# Patient Record
Sex: Female | Born: 1970 | Race: Black or African American | Hispanic: No | Marital: Married | Smoking: Never smoker
Health system: Southern US, Community
[De-identification: ages and names within clinical notes are randomized; demographics above are authoritative.]

## PROBLEM LIST (undated history)

## (undated) DIAGNOSIS — E119 Type 2 diabetes mellitus without complications: Secondary | ICD-10-CM

## (undated) HISTORY — DX: Type 2 diabetes mellitus without complications: E11.9

---

## 2008-08-12 ENCOUNTER — Inpatient Hospital Stay (HOSPITAL_COMMUNITY): Admission: AD | Admit: 2008-08-12 | Discharge: 2008-08-16 | Payer: Self-pay | Admitting: Obstetrics

## 2010-09-22 ENCOUNTER — Emergency Department (HOSPITAL_COMMUNITY): Admission: EM | Admit: 2010-09-22 | Discharge: 2010-09-22 | Payer: Self-pay | Admitting: Emergency Medicine

## 2010-11-10 ENCOUNTER — Ambulatory Visit (HOSPITAL_COMMUNITY): Admission: RE | Admit: 2010-11-10 | Payer: Self-pay | Admitting: Obstetrics & Gynecology

## 2010-12-12 ENCOUNTER — Encounter: Payer: Self-pay | Admitting: Obstetrics & Gynecology

## 2011-02-01 LAB — COMPREHENSIVE METABOLIC PANEL
ALT: 13 U/L (ref 0–35)
AST: 19 U/L (ref 0–37)
Albumin: 3.2 g/dL — ABNORMAL LOW (ref 3.5–5.2)
Alkaline Phosphatase: 52 U/L (ref 39–117)
BUN: 8 mg/dL (ref 6–23)
CO2: 26 mEq/L (ref 19–32)
Calcium: 8.6 mg/dL (ref 8.4–10.5)
Chloride: 108 mEq/L (ref 96–112)
Creatinine, Ser: 0.81 mg/dL (ref 0.4–1.2)
GFR calc Af Amer: >60 mL/min (ref >60)
GFR calc non Af Amer: >60 mL/min (ref >60)
Glucose, Bld: 160 mg/dL — ABNORMAL HIGH (ref 70–99)
Potassium: 3.9 mEq/L (ref 3.5–5.1)
Sodium: 141 mEq/L (ref 135–145)
Total Bilirubin: 0.3 mg/dL (ref 0.3–1.2)
Total Protein: 6.7 g/dL (ref 6.0–8.3)

## 2011-02-01 LAB — DIFFERENTIAL
Eosinophils Relative: 0 % (ref 0–5)
Lymphocytes Relative: 16 % (ref 12–46)
Monocytes Relative: 4 % (ref 3–12)
Neutro Abs: 6.9 10*3/uL (ref 1.7–7.7)

## 2011-02-01 LAB — CBC
HCT: 38.8 % (ref 36.0–46.0)
Hemoglobin: 12.5 g/dL (ref 12.0–15.0)
MCH: 23.4 pg — ABNORMAL LOW (ref 26.0–34.0)
MCHC: 32.2 g/dL (ref 30.0–36.0)
MCV: 72.5 fL — ABNORMAL LOW (ref 78.0–100.0)
Platelets: 262 10*3/uL (ref 150–400)
RBC: 5.35 MIL/uL — ABNORMAL HIGH (ref 3.87–5.11)
RDW: 16.1 % — ABNORMAL HIGH (ref 11.5–15.5)
WBC: 8.7 10*3/uL (ref 4.0–10.5)

## 2011-02-01 LAB — URINE MICROSCOPIC-ADD ON

## 2011-02-01 LAB — URINALYSIS, ROUTINE W REFLEX MICROSCOPIC
Glucose, UA: NEGATIVE mg/dL
Ketones, ur: NEGATIVE mg/dL
Specific Gravity, Urine: 1.025 (ref 1.005–1.030)
Urobilinogen, UA: 0.2 mg/dL (ref 0.0–1.0)
pH: 8.5 — ABNORMAL HIGH (ref 5.0–8.0)

## 2011-04-05 NOTE — Op Note (Signed)
NAMEAUBRI, Sonya Kane                ACCOUNT NO.:  [id number]   MEDICAL RECORD NO.:  [id number]          PATIENT TYPE:  INP   LOCATION:  9134                          FACILITY:  [facility]   PHYSICIAN:  Roseanna Rainbow, M.D.DATE OF BIRTH:  14-Jul-1971   DATE OF PROCEDURE:  08/13/2008  DATE OF DISCHARGE:                               OPERATIVE REPORT   PREOPERATIVE DIAGNOSES:  1. Intrauterine pregnancy at 37+ weeks.  2. Prolonged rupture of membranes.  3. Failed induction of labor.   POSTOPERATIVE DIAGNOSES:  1. Intrauterine pregnancy at 37+ weeks.  2. Prolonged rupture of membranes.  3. Failed induction of labor.   PROCEDURE:  Primary low uterine flap elliptical cesarean delivery via  Pfannenstiel skin incision.   SURGEON:  Roseanna Rainbow, MD, Bing Neighbors. Clearance Coots, MD   ANESTHESIA:  Spinal.   ESTIMATED BLOOD LOSS:  600 mL.   COMPLICATIONS:  None.   PROCEDURE:  The patient was taken to the operating room with an IV  running.  A spinal anesthetic was then administered.  She was placed in  the dorsal supine position with a leftward tilt and prepped and draped  in the usual sterile fashion.  After a time-out had been completed, a  Pfannenstiel skin incision was made with a scalpel and carried down to  the underlying fascia with a Bovie.  The fascia was nicked in the  midline.  The superior aspect of the fascial incision was tented up and  the underlying rectus muscles dissected off.  The inferior aspect of the  fascial incision was manipulated in a similar fashion.  The rectus  muscles were separated in the midline.  The parietal peritoneum was  tented up and entered sharply.  This incision was then extended  superiorly and inferiorly with good visualization of the bladder.  The  bladder blade was then placed.  The vesicouterine peritoneum was tented  up and entered sharply.  This incision was then extended bilaterally,  and the bladder flap created bluntly.  There was a  fairly large  superficial varix involving the lower uterine segment.  This was clamped  with Kelly clamps and the uterine incision was made between the 2 Kelly  clamps.  The uterine incision was then extended with bandage scissors.  The infant's head was delivered with vacuum assistance.  The oropharynx  was suctioned with bulb suction.  The cord was clamped and cut.  The  infant was brought over to the awaiting neonatologist.  The placenta was  then removed.  The intrauterine cavity was evacuated of any remaining  amniotic fluid clots and debrided with moist laparotomy sponge.  The  uterine incision was then reapproximated in a running interlocking  fashion using a running suture of 0 Monocryl.  A second imbricating  layer of the same suture was then placed.  Adequate hemostasis was  noted.  The paracolic gutters were then irrigated.  The parietal  peritoneum was reapproximated in a running fashion using 2-0  Vicryl.  The fascia was closed in a running fashion using 2 sutures of 0  Vicryl.  The skin  was closed with staples.  At the close of the  procedure, the instrument and pack counts were said to be correct x2.  The patient was taken to the PACU awake and in stable condition.      Roseanna Rainbow, M.D.  Electronically Signed     LAJ/MEDQ  D:  08/13/2008  T:  08/14/2008  Job:  [id number]

## 2011-04-08 NOTE — Discharge Summary (Signed)
Sonya Kane, Sonya Kane                ACCOUNT NO.:  [id number]   MEDICAL RECORD NO.:  [id number]          PATIENT TYPE:  INP   LOCATION:  9134                          FACILITY:  [facility]   PHYSICIAN:  Roseanna Rainbow, M.D.DATE OF BIRTH:  March 15, 1971   DATE OF ADMISSION:  08/12/2008  DATE OF DISCHARGE:  08/16/2008                               DISCHARGE SUMMARY   HISTORY AND PHYSICAL:  A 40 year old gravida 2, para 0, with an  estimated date of confinement of August 28, 2008, now with rupture of  membranes.   HISTORY OF PRESENT ILLNESS:  Please see the above.  Antepartum course  complicated to date by pregnancy-induced hypertension. She is GBS  positive.   PAST SURGICAL HISTORY:  She denies.   PAST MEDICAL HISTORY:  Anemia.   MEDICATIONS:  Prenatal vitamins.   ALLERGIES:  PENICILLIN.   SOCIAL HISTORY:  She is divorced.  She denies any tobacco, ethanol, or  drug use.   FAMILY HISTORY:  Remarkable for diabetes, hypertension.   PHYSICAL EXAMINATION:  VITAL SIGNS:  Stable, afebrile.  LUNGS:  Clear to auscultation bilaterally.  HEART:  Regular rate and rhythm.  ABDOMEN:  Gravid, nontender.  EXTERNAL GENITALIA:  Cervix is 1 cm dilated, long, vertex at -3 station.   ASSESSMENT AND PLAN:  Intrauterine pregnancy at 37 weeks 6 days with  rupture of membranes.   PLAN:  Admission, Pitocin augmentation of labor, GBS prophylaxis with  clindamycin.   HOSPITAL COURSE:  The patient was admitted on August 13, 2008.  The  cervix remained without change despite attempts to augment labor with  Pitocin.  At this point, the decision was made to proceed with a  cesarean delivery for failed induction of labor.  Please see the  dictated operative summary.  Her postoperative course was uneventful.  She was discharged to home on postoperative day #3.   DISCHARGE DIAGNOSES:  1. Intrauterine pregnancy at term.  2. Failed augmentation of labor.  3. PROM.   PROCEDURE:  Cesarean delivery.   CONDITION:  Good.   DIET:  Regular.   ACTIVITY:  Pelvic rest, progressive activity.   MEDICATIONS:  Percocet.   DISPOSITION:  The patient was to follow up in the office in 2 weeks.      Roseanna Rainbow, M.D.  Electronically Signed     LAJ/MEDQ  D:  09/08/2008  T:  09/08/2008  Job:  [id number]

## 2011-04-10 ENCOUNTER — Emergency Department (HOSPITAL_COMMUNITY)
Admission: EM | Admit: 2011-04-10 | Discharge: 2011-04-10 | Disposition: A | Discharge status: Home / Self Care | Payer: 59 | Attending: Emergency Medicine | Admitting: Emergency Medicine

## 2011-04-10 DIAGNOSIS — G510 Bell's palsy: Secondary | ICD-10-CM | POA: Insufficient documentation

## 2011-04-11 ENCOUNTER — Other Ambulatory Visit: Payer: Self-pay | Admitting: Obstetrics & Gynecology

## 2011-04-11 DIAGNOSIS — Z1231 Encounter for screening mammogram for malignant neoplasm of breast: Secondary | ICD-10-CM

## 2011-04-13 ENCOUNTER — Other Ambulatory Visit: Payer: Self-pay | Admitting: Obstetrics & Gynecology

## 2011-04-13 DIAGNOSIS — N926 Irregular menstruation, unspecified: Secondary | ICD-10-CM

## 2011-05-20 ENCOUNTER — Ambulatory Visit (HOSPITAL_COMMUNITY)
Admission: RE | Admit: 2011-05-20 | Discharge: 2011-05-20 | Disposition: A | Discharge status: Home / Self Care | Payer: 59 | Source: Ambulatory Visit | Attending: Obstetrics & Gynecology | Admitting: Obstetrics & Gynecology

## 2011-05-20 ENCOUNTER — Ambulatory Visit (HOSPITAL_COMMUNITY): Payer: 59

## 2011-05-20 DIAGNOSIS — N926 Irregular menstruation, unspecified: Secondary | ICD-10-CM

## 2011-05-20 DIAGNOSIS — Z1231 Encounter for screening mammogram for malignant neoplasm of breast: Secondary | ICD-10-CM | POA: Insufficient documentation

## 2011-08-22 LAB — CBC
MCHC: 32.4
MCHC: 33.4
MCV: 75.1 — ABNORMAL LOW
Platelets: 237
RDW: 19 — ABNORMAL HIGH

## 2011-08-22 LAB — RPR: RPR Ser Ql: NONREACTIVE

## 2012-12-10 ENCOUNTER — Other Ambulatory Visit: Payer: Self-pay | Admitting: Obstetrics & Gynecology

## 2012-12-10 DIAGNOSIS — Z1231 Encounter for screening mammogram for malignant neoplasm of breast: Secondary | ICD-10-CM

## 2012-12-19 ENCOUNTER — Ambulatory Visit (HOSPITAL_COMMUNITY)
Admission: RE | Admit: 2012-12-19 | Discharge: 2012-12-19 | Disposition: A | Discharge status: Home / Self Care | Payer: BC Managed Care – PPO | Source: Ambulatory Visit | Attending: Obstetrics & Gynecology | Admitting: Obstetrics & Gynecology

## 2012-12-19 DIAGNOSIS — Z1231 Encounter for screening mammogram for malignant neoplasm of breast: Secondary | ICD-10-CM | POA: Insufficient documentation

## 2013-03-03 ENCOUNTER — Other Ambulatory Visit (HOSPITAL_COMMUNITY): Payer: Self-pay | Admitting: Obstetrics & Gynecology

## 2013-03-03 DIAGNOSIS — IMO0001 Reserved for inherently not codable concepts without codable children: Secondary | ICD-10-CM

## 2013-03-04 NOTE — Telephone Encounter (Signed)
Prescription sent to me in error

## 2013-12-11 ENCOUNTER — Other Ambulatory Visit: Payer: Self-pay | Admitting: Obstetrics & Gynecology

## 2013-12-11 DIAGNOSIS — Z1231 Encounter for screening mammogram for malignant neoplasm of breast: Secondary | ICD-10-CM

## 2013-12-25 ENCOUNTER — Encounter: Payer: Self-pay | Admitting: Obstetrics & Gynecology

## 2013-12-25 ENCOUNTER — Ambulatory Visit (INDEPENDENT_AMBULATORY_CARE_PROVIDER_SITE_OTHER): Payer: BC Managed Care – PPO | Admitting: Obstetrics & Gynecology

## 2013-12-25 ENCOUNTER — Ambulatory Visit (HOSPITAL_COMMUNITY)
Admission: RE | Admit: 2013-12-25 | Discharge: 2013-12-25 | Disposition: A | Discharge status: Home / Self Care | Payer: BC Managed Care – HMO | Source: Ambulatory Visit | Attending: Obstetrics & Gynecology | Admitting: Obstetrics & Gynecology

## 2013-12-25 ENCOUNTER — Ambulatory Visit (HOSPITAL_COMMUNITY): Payer: 59

## 2013-12-25 VITALS — BP 155/84 | HR 82 | Temp 97.6°F | Ht 71.0 in | Wt 260.0 lb

## 2013-12-25 DIAGNOSIS — Z1231 Encounter for screening mammogram for malignant neoplasm of breast: Secondary | ICD-10-CM | POA: Insufficient documentation

## 2013-12-25 DIAGNOSIS — Z01419 Encounter for gynecological examination (general) (routine) without abnormal findings: Secondary | ICD-10-CM

## 2013-12-25 NOTE — Progress Notes (Signed)
Subjective:     Sonya Kane is a 43 y.o. female here for a routine exam.  Current complaints: Patient in office today for an annual exam.  Personal health questionnaire reviewed: yes.   Gynecologic History Patient's last menstrual period was 12/11/2013. Contraception: OCP (estrogen/progesterone) Last Pap: 2014. Results were: normal Last mammogram: 2014. Results were: normal  Obstetric History OB History  Gravida Para Term Preterm AB SAB TAB Ectopic Multiple Living  1 1 1       1     # Outcome Date GA Lbr Len/2nd Weight Sex Delivery Anes PTL Lv  1 TRM 08/13/08 [id number]  6 lb 8 oz (2.948 kg) M LTCS EPI  Y       The following portions of the patient's history were reviewed and updated as appropriate: allergies, current medications, past family history, past medical history, past social history, past surgical history and problem list.  Review of Systems Pertinent items are noted in HPI.    Objective:    BP 155/84  Pulse 82  Temp(Src) 97.6 F (36.4 C)  Ht 5\' 11"  (1.803 m)  Wt 260 lb (117.935 kg)  BMI 36.28 kg/m2  LMP 12/11/2013  General Appearance:    Alert, cooperative, no distress, appears stated age  Breast Exam:    No tenderness, masses, or nipple abnormality  Abdomen:     Soft, non-tender, bowel sounds active all four quadrants,    no masses, no organomegaly  Genitalia:    Normal female without lesion, discharge or tenderness    Assessment:   Healthy female exam.   Plan:   Referral to PCP Pap smear not done today Std screening declined

## 2013-12-25 NOTE — Patient Instructions (Addendum)
Follow up one year with pap Health Maintenance, Female A healthy lifestyle and preventative care can promote health and wellness.  Maintain regular health, dental, and eye exams.  Eat a healthy diet. Foods like vegetables, fruits, whole grains, low-fat dairy products, and lean protein foods contain the nutrients you need without too many calories. Decrease your intake of foods high in solid fats, added sugars, and salt. Get information about a proper diet from your caregiver, if necessary.  Regular physical exercise is one of the most important things you can do for your health. Most adults should get at least 150 minutes of moderate-intensity exercise (any activity that increases your heart rate and causes you to sweat) each week. In addition, most adults need muscle-strengthening exercises on 2 or more days a week.   Maintain a healthy weight. The body mass index (BMI) is a screening tool to identify possible weight problems. It provides an estimate of body fat based on height and weight. Your caregiver can help determine your BMI, and can help you achieve or maintain a healthy weight. For adults 20 years and older:  A BMI below 18.5 is considered underweight.  A BMI of 18.5 to 24.9 is normal.  A BMI of 25 to 29.9 is considered overweight.  A BMI of 30 and above is considered obese.  Maintain normal blood lipids and cholesterol by exercising and minimizing your intake of saturated fat. Eat a balanced diet with plenty of fruits and vegetables. Blood tests for lipids and cholesterol should begin at age 80 and be repeated every 5 years. If your lipid or cholesterol levels are high, you are over 50, or you are a high risk for heart disease, you may need your cholesterol levels checked more frequently.Ongoing high lipid and cholesterol levels should be treated with medicines if diet and exercise are not effective.  If you smoke, find out from your caregiver how to quit. If you do not use  tobacco, do not start.  Lung cancer screening is recommended for adults aged 14 80 years who are at high risk for developing lung cancer because of a history of smoking. Yearly low-dose computed tomography (CT) is recommended for people who have at least a 30-pack-year history of smoking and are a current smoker or have quit within the past 15 years. A pack year of smoking is smoking an average of 1 pack of cigarettes a day for 1 year (for example: 1 pack a day for 30 years or 2 packs a day for 15 years). Yearly screening should continue until the smoker has stopped smoking for at least 15 years. Yearly screening should also be stopped for people who develop a health problem that would prevent them from having lung cancer treatment.  If you are pregnant, do not drink alcohol. If you are breastfeeding, be very cautious about drinking alcohol. If you are not pregnant and choose to drink alcohol, do not exceed 1 drink per day. One drink is considered to be 12 ounces (355 mL) of beer, 5 ounces (148 mL) of wine, or 1.5 ounces (44 mL) of liquor.  Avoid use of street drugs. Do not share needles with anyone. Ask for help if you need support or instructions about stopping the use of drugs.  High blood pressure causes heart disease and increases the risk of stroke. Blood pressure should be checked at least every 1 to 2 years. Ongoing high blood pressure should be treated with medicines, if weight loss and exercise are not effective.  If you are 60 to 44 years old, ask your caregiver if you should take aspirin to prevent strokes.  Diabetes screening involves taking a blood sample to check your fasting blood sugar level. This should be done once every 3 years, after age 73, if you are within normal weight and without risk factors for diabetes. Testing should be considered at a younger age or be carried out more frequently if you are overweight and have at least 1 risk factor for diabetes.  Breast cancer screening  is essential preventative care for women. You should practice "breast self-awareness." This means understanding the normal appearance and feel of your breasts and may include breast self-examination. Any changes detected, no matter how small, should be reported to a caregiver. Women in their 4s and 30s should have a clinical breast exam (CBE) by a caregiver as part of a regular health exam every 1 to 3 years. After age 20, women should have a CBE every year. Starting at age 69, women should consider having a mammogram (breast X-ray) every year. Women who have a family history of breast cancer should talk to their caregiver about genetic screening. Women at a high risk of breast cancer should talk to their caregiver about having an MRI and a mammogram every year.  Breast cancer gene (BRCA)-related cancer risk assessment is recommended for women who have family members with BRCA-related cancers. BRCA-related cancers include breast, ovarian, tubal, and peritoneal cancers. Having family members with these cancers may be associated with an increased risk for harmful changes (mutations) in the breast cancer genes BRCA1 and BRCA2. Results of the assessment will determine the need for genetic counseling and BRCA1 and BRCA2 testing.  The Pap test is a screening test for cervical cancer. Women should have a Pap test starting at age 28. Between ages 27 and 45, Pap tests should be repeated every 2 years. Beginning at age 68, you should have a Pap test every 3 years as long as the past 3 Pap tests have been normal. If you had a hysterectomy for a problem that was not cancer or a condition that could lead to cancer, then you no longer need Pap tests. If you are between ages 107 and 67, and you have had normal Pap tests going back 10 years, you no longer need Pap tests. If you have had past treatment for cervical cancer or a condition that could lead to cancer, you need Pap tests and screening for cancer for at least 20 years  after your treatment. If Pap tests have been discontinued, risk factors (such as a new sexual partner) need to be reassessed to determine if screening should be resumed. Some women have medical problems that increase the chance of getting cervical cancer. In these cases, your caregiver may recommend more frequent screening and Pap tests.  The human papillomavirus (HPV) test is an additional test that may be used for cervical cancer screening. The HPV test looks for the virus that can cause the cell changes on the cervix. The cells collected during the Pap test can be tested for HPV. The HPV test could be used to screen women aged 47 years and older, and should be used in women of any age who have unclear Pap test results. After the age of 25, women should have HPV testing at the same frequency as a Pap test.  Colorectal cancer can be detected and often prevented. Most routine colorectal cancer screening begins at the age of 71 and continues through  age 29. However, your caregiver may recommend screening at an earlier age if you have risk factors for colon cancer. On a yearly basis, your caregiver may provide home test kits to check for hidden blood in the stool. Use of a small camera at the end of a tube, to directly examine the colon (sigmoidoscopy or colonoscopy), can detect the earliest forms of colorectal cancer. Talk to your caregiver about this at age 14, when routine screening begins. Direct examination of the colon should be repeated every 5 to 10 years through age 40, unless early forms of pre-cancerous polyps or small growths are found.  Hepatitis C blood testing is recommended for all people born from [birth year] through 1965 and any individual with known risks for hepatitis C.  Practice safe sex. Use condoms and avoid high-risk sexual practices to reduce the spread of sexually transmitted infections (STIs). Sexually active women aged 16 and younger should be checked for Chlamydia, which is a common  sexually transmitted infection. Older women with new or multiple partners should also be tested for Chlamydia. Testing for other STIs is recommended if you are sexually active and at increased risk.  Osteoporosis is a disease in which the bones lose minerals and strength with aging. This can result in serious bone fractures. The risk of osteoporosis can be identified using a bone density scan. Women ages 68 and over and women at risk for fractures or osteoporosis should discuss screening with their caregivers. Ask your caregiver whether you should be taking a calcium supplement or vitamin D to reduce the rate of osteoporosis.  Menopause can be associated with physical symptoms and risks. Hormone replacement therapy is available to decrease symptoms and risks. You should talk to your caregiver about whether hormone replacement therapy is right for you.  Use sunscreen. Apply sunscreen liberally and repeatedly throughout the day. You should seek shade when your shadow is shorter than you. Protect yourself by wearing long sleeves, pants, a wide-brimmed hat, and sunglasses year round, whenever you are outdoors.  Notify your caregiver of new moles or changes in moles, especially if there is a change in shape or color. Also notify your caregiver if a mole is larger than the size of a pencil eraser.  Stay current with your immunizations. Document Released: 05/23/2011 Document Revised: 03/04/2013 Document Reviewed: 05/23/2011 [facility] Patient Information 2014 [place].

## 2014-01-26 ENCOUNTER — Other Ambulatory Visit: Payer: Self-pay | Admitting: Obstetrics & Gynecology

## 2014-01-28 NOTE — Telephone Encounter (Signed)
Please review

## 2014-09-22 ENCOUNTER — Encounter: Payer: Self-pay | Admitting: Obstetrics & Gynecology

## 2014-11-17 ENCOUNTER — Encounter: Payer: Self-pay | Admitting: *Deleted

## 2014-11-18 ENCOUNTER — Encounter: Payer: Self-pay | Admitting: Obstetrics & Gynecology

## 2015-03-02 ENCOUNTER — Telehealth: Payer: Self-pay | Admitting: Obstetrics

## 2015-03-03 ENCOUNTER — Telehealth: Payer: Self-pay | Admitting: Obstetrics

## 2015-03-09 NOTE — Telephone Encounter (Signed)
Closed encounter °

## 2015-12-28 ENCOUNTER — Other Ambulatory Visit: Payer: Self-pay

## 2015-12-28 DIAGNOSIS — Z1231 Encounter for screening mammogram for malignant neoplasm of breast: Secondary | ICD-10-CM

## 2016-01-08 ENCOUNTER — Ambulatory Visit
Admission: RE | Admit: 2016-01-08 | Discharge: 2016-01-08 | Disposition: A | Discharge status: Home / Self Care | Payer: BLUE CROSS/BLUE SHIELD | Source: Ambulatory Visit

## 2016-01-08 DIAGNOSIS — Z1231 Encounter for screening mammogram for malignant neoplasm of breast: Secondary | ICD-10-CM

## 2017-07-28 ENCOUNTER — Other Ambulatory Visit: Payer: Self-pay | Admitting: Obstetrics and Gynecology

## 2017-07-28 DIAGNOSIS — Z1231 Encounter for screening mammogram for malignant neoplasm of breast: Secondary | ICD-10-CM

## 2017-08-18 ENCOUNTER — Ambulatory Visit
Admission: RE | Admit: 2017-08-18 | Discharge: 2017-08-18 | Disposition: A | Discharge status: Home / Self Care | Payer: BLUE CROSS/BLUE SHIELD | Source: Ambulatory Visit | Attending: Obstetrics and Gynecology | Admitting: Obstetrics and Gynecology

## 2017-08-18 DIAGNOSIS — Z1231 Encounter for screening mammogram for malignant neoplasm of breast: Secondary | ICD-10-CM

## 2018-11-26 ENCOUNTER — Other Ambulatory Visit: Payer: Self-pay | Admitting: Obstetrics and Gynecology

## 2018-11-26 DIAGNOSIS — Z1231 Encounter for screening mammogram for malignant neoplasm of breast: Secondary | ICD-10-CM

## 2019-01-04 ENCOUNTER — Ambulatory Visit
Admission: RE | Admit: 2019-01-04 | Discharge: 2019-01-04 | Disposition: A | Discharge status: Home / Self Care | Payer: BLUE CROSS/BLUE SHIELD | Source: Ambulatory Visit | Attending: Obstetrics and Gynecology | Admitting: Obstetrics and Gynecology

## 2019-01-04 DIAGNOSIS — Z1231 Encounter for screening mammogram for malignant neoplasm of breast: Secondary | ICD-10-CM

## 2020-02-27 ENCOUNTER — Ambulatory Visit: Payer: BLUE CROSS/BLUE SHIELD | Admitting: Physician Assistant

## 2020-04-02 ENCOUNTER — Ambulatory Visit: Payer: BC Managed Care – PPO | Admitting: Physician Assistant

## 2020-04-02 ENCOUNTER — Encounter (INDEPENDENT_AMBULATORY_CARE_PROVIDER_SITE_OTHER): Payer: Self-pay

## 2020-04-02 ENCOUNTER — Encounter: Payer: Self-pay | Admitting: Physician Assistant

## 2020-04-02 ENCOUNTER — Other Ambulatory Visit: Payer: Self-pay

## 2020-04-02 DIAGNOSIS — L708 Other acne: Secondary | ICD-10-CM | POA: Diagnosis not present

## 2020-04-02 DIAGNOSIS — B079 Viral wart, unspecified: Secondary | ICD-10-CM

## 2020-04-02 MED ORDER — MINOCYCLINE HCL 50 MG PO CAPS: 100.0000 mg | ORAL_CAPSULE | Freq: Two times a day (BID) | ORAL | 2 refills | Status: AC

## 2020-04-02 MED ORDER — CLINDAMYCIN PHOSPHATE 1 % EX GEL: Freq: Every evening | CUTANEOUS | 3 refills | Status: DC

## 2020-04-02 NOTE — Patient Instructions (Addendum)
Start using OTC hydrocortisone ointment for your face. Minocycline: side effects: headache, nausea, allergy, GI upset

## 2020-04-02 NOTE — Progress Notes (Signed)
   Follow-Up Visit   Subjective  Sonya Kane is an AA 49 y.o. female who presents for the following: Skin Problem (Here this afternoon because she is experiencing breaking out underneath her mask. Using Cetaphil wash. Feels like her skin is sensitive. Worried about the discoloration from the hyperpigmentation.) and Skin Tag (Front of her neck, left side. Always irritated by her seat belt.). Looks warty  he following portions of the chart were reviewed this encounter and updated as appropriate: Tobacco  Allergies  Meds  Problems  Med Hx  Surg Hx  Fam Hx      Objective  Well appearing patient in no apparent distress; mood and affect are within normal limits.  A focused examination was performed including neck and face. Relevant physical exam findings are noted in the Assessment and Plan.  Objective  Head - Anterior (Face) (2): Follicularly based papules and pustules with comedones   Objective  Neck - Anterior: Verrucous papule-- Discussed viral etiology and contagion.    Assessment & Plan  Other acne (2) Head - Anterior (Face)  Ordered Medications: minocycline (MINOCIN) 50 MG capsule clindamycin (CLINDAGEL) 1 % gel  Viral warts, unspecified type Neck - Anterior  Destruction of lesion - Neck - Anterior Complexity: simple   Destruction method comment:  Scissors were used to snip tag at the base Informed consent: discussed and consent obtained   Timeout:  patient name, date of birth, surgical site, and procedure verified Anesthesia: the lesion was anesthetized in a standard fashion   Hemostasis achieved with:  pressure Outcome: patient tolerated procedure well with no complications   Post-procedure details: wound care instructions given

## 2020-08-04 ENCOUNTER — Other Ambulatory Visit: Payer: Self-pay | Admitting: Physician Assistant

## 2020-08-04 DIAGNOSIS — Z1231 Encounter for screening mammogram for malignant neoplasm of breast: Secondary | ICD-10-CM

## 2020-08-28 ENCOUNTER — Ambulatory Visit
Admission: RE | Admit: 2020-08-28 | Discharge: 2020-08-28 | Disposition: A | Discharge status: Home / Self Care | Payer: BC Managed Care – PPO | Source: Ambulatory Visit | Attending: Physician Assistant | Admitting: Physician Assistant

## 2020-08-28 ENCOUNTER — Other Ambulatory Visit: Payer: Self-pay

## 2020-08-28 DIAGNOSIS — Z1231 Encounter for screening mammogram for malignant neoplasm of breast: Secondary | ICD-10-CM

## 2020-08-31 ENCOUNTER — Ambulatory Visit: Payer: BC Managed Care – PPO

## 2021-01-29 ENCOUNTER — Other Ambulatory Visit: Payer: Self-pay | Admitting: Physician Assistant

## 2021-01-29 DIAGNOSIS — L708 Other acne: Secondary | ICD-10-CM

## 2021-02-01 NOTE — Telephone Encounter (Signed)
Patient needs office visit in May

## 2021-03-08 ENCOUNTER — Other Ambulatory Visit: Payer: Self-pay | Admitting: Dermatology

## 2021-03-08 DIAGNOSIS — L708 Other acne: Secondary | ICD-10-CM

## 2022-01-26 ENCOUNTER — Other Ambulatory Visit: Payer: Self-pay | Admitting: Physician Assistant

## 2022-01-26 DIAGNOSIS — Z1231 Encounter for screening mammogram for malignant neoplasm of breast: Secondary | ICD-10-CM

## 2022-03-15 IMAGING — MG DIGITAL SCREENING BILAT W/ TOMO W/ CAD
6 of 12 series · 6 of 36 positions shown · non-contrast
Comparison: Previous exam(s).

CLINICAL DATA: Screening.

EXAM:
DIGITAL SCREENING BILATERAL MAMMOGRAM WITH TOMO AND CAD

[R MLO synth-2D (1 of 2)]
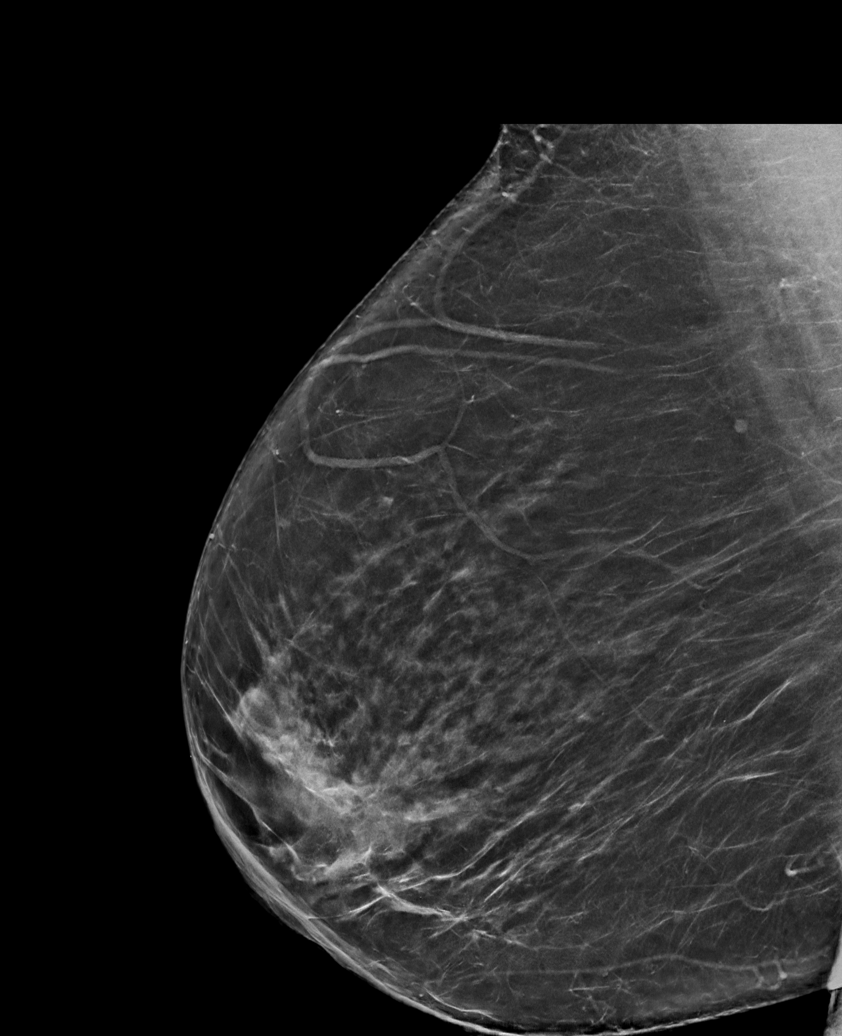

[R MLO synth-2D (2 of 2)]
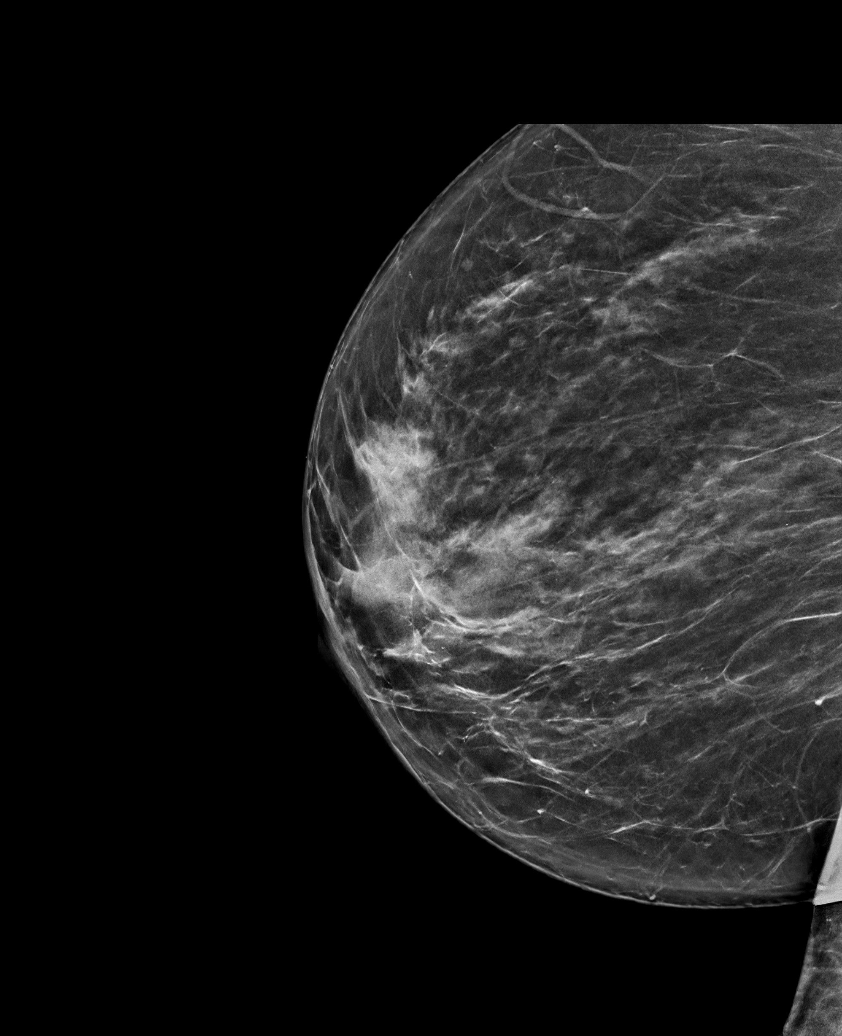

[R CC synth-2D]
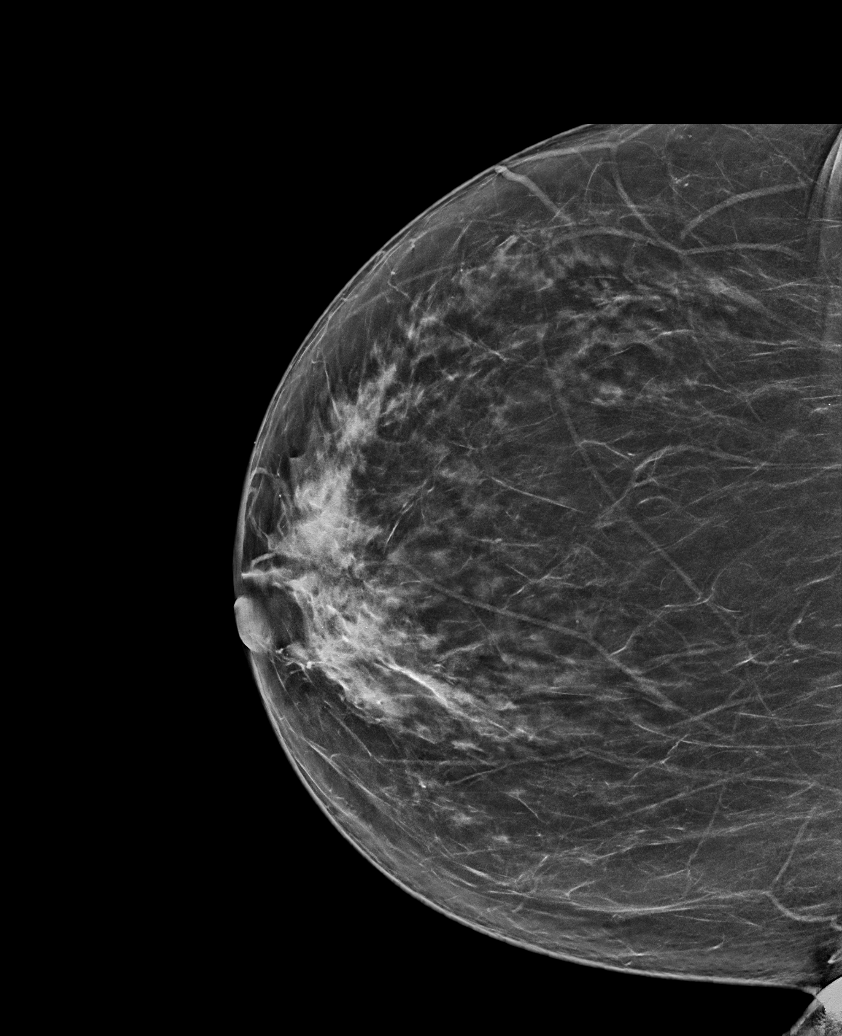

[L MLO synth-2D (1 of 2)]
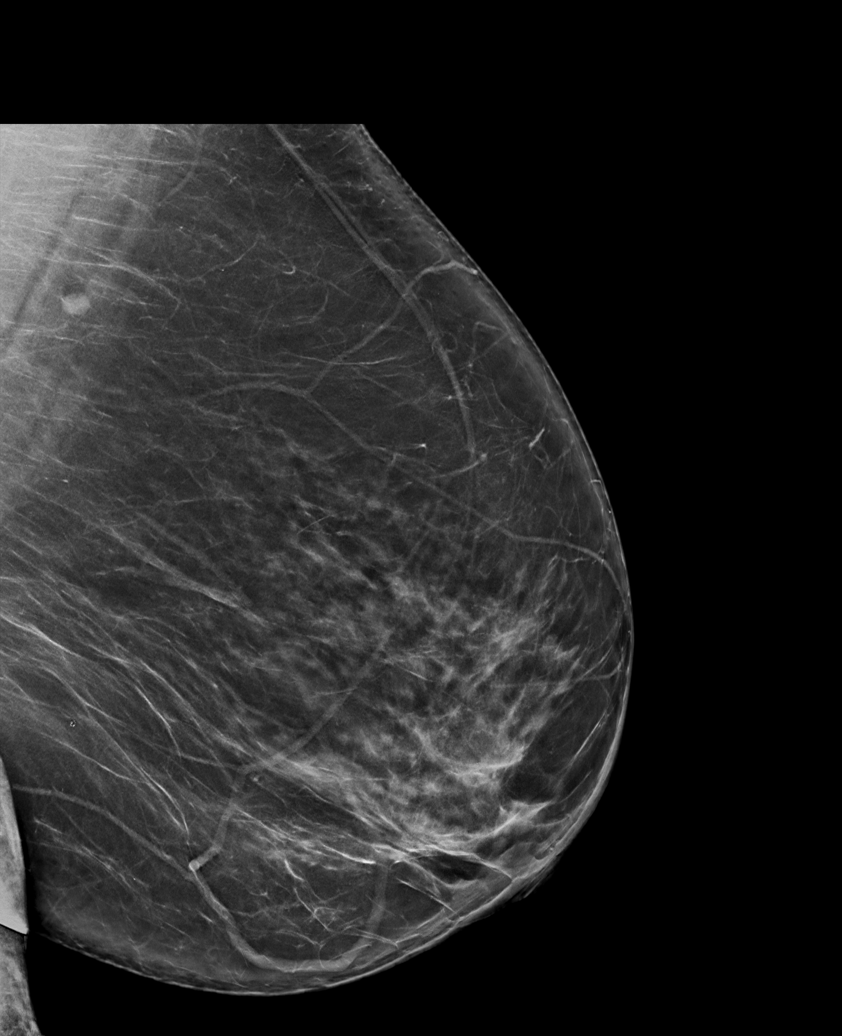

[L MLO synth-2D (2 of 2)]
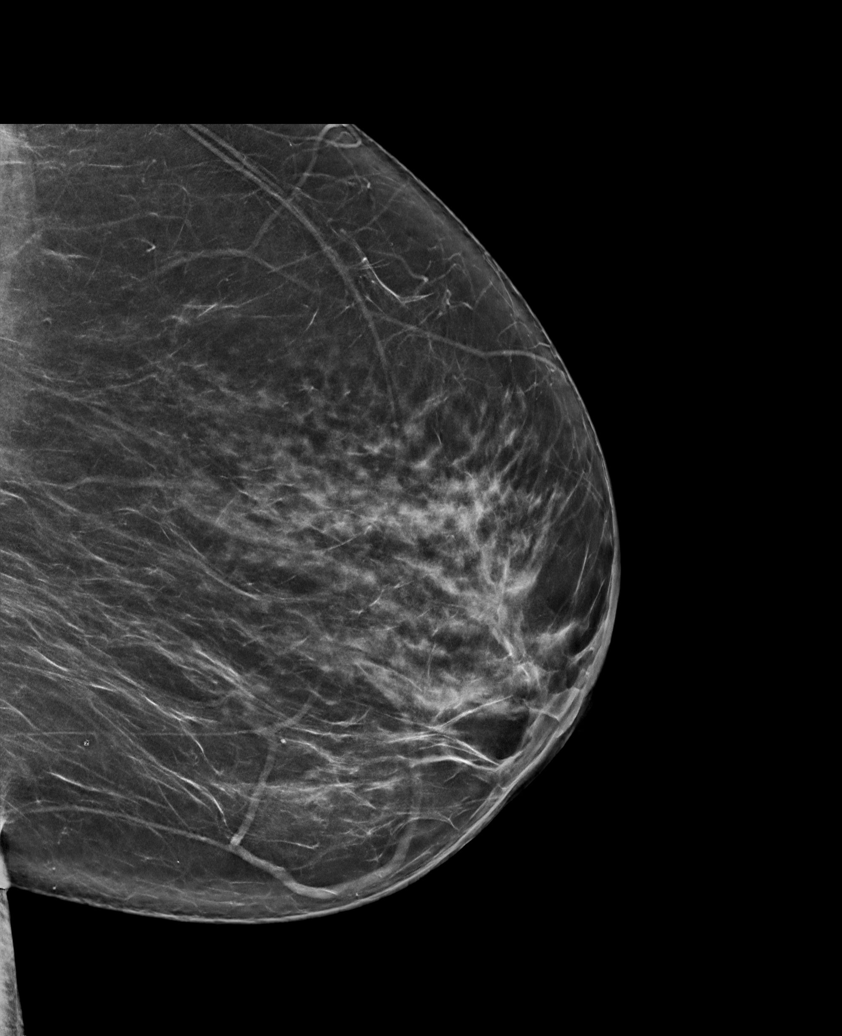

[L CC synth-2D]
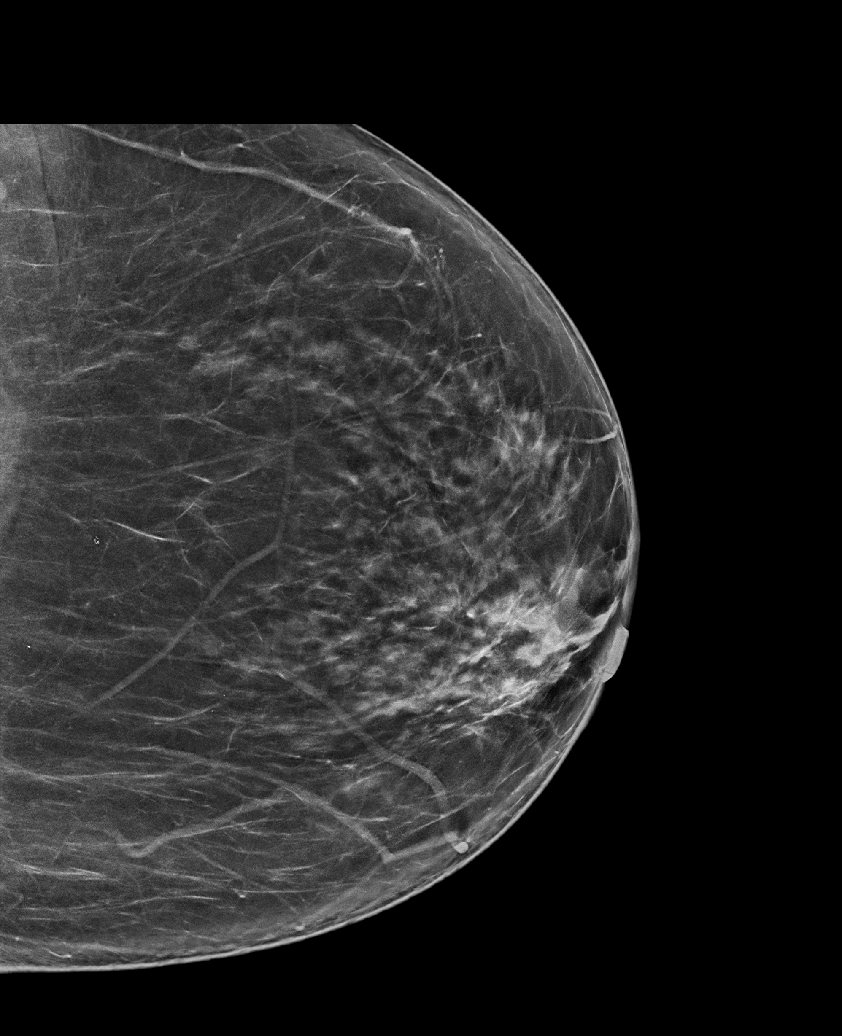

[6 of 36 positions shown; findings below may reference images not displayed]

ACR Breast Density Category c: The breast tissue is heterogeneously
dense, which may obscure small masses.
FINDINGS: There are no findings suspicious for malignancy. Images were
processed with CAD.
IMPRESSION: No mammographic evidence of malignancy. A result letter of this
screening mammogram will be mailed directly to the patient.

RECOMMENDATION:
Screening mammogram in one year. (Code:[id number]-U-LHB)

BI-RADS CATEGORY  1: Negative.

## 2022-06-28 ENCOUNTER — Ambulatory Visit
Admission: RE | Admit: 2022-06-28 | Discharge: 2022-06-28 | Disposition: A | Discharge status: Home / Self Care | Payer: BC Managed Care – PPO | Source: Ambulatory Visit | Attending: Physician Assistant | Admitting: Physician Assistant

## 2022-06-28 DIAGNOSIS — Z1231 Encounter for screening mammogram for malignant neoplasm of breast: Secondary | ICD-10-CM

## 2022-06-30 ENCOUNTER — Other Ambulatory Visit: Payer: Self-pay | Admitting: Physician Assistant

## 2022-06-30 DIAGNOSIS — R928 Other abnormal and inconclusive findings on diagnostic imaging of breast: Secondary | ICD-10-CM

## 2022-07-15 ENCOUNTER — Ambulatory Visit
Admission: RE | Admit: 2022-07-15 | Discharge: 2022-07-15 | Disposition: A | Discharge status: Home / Self Care | Payer: BC Managed Care – PPO | Source: Ambulatory Visit | Attending: Physician Assistant | Admitting: Physician Assistant

## 2022-07-15 ENCOUNTER — Ambulatory Visit: Payer: BC Managed Care – PPO

## 2022-07-15 ENCOUNTER — Ambulatory Visit: Admission: RE | Admit: 2022-07-15 | Payer: BC Managed Care – PPO | Source: Ambulatory Visit

## 2022-07-15 DIAGNOSIS — R928 Other abnormal and inconclusive findings on diagnostic imaging of breast: Secondary | ICD-10-CM

## 2022-09-21 DEATH — deceased
